# Patient Record
Sex: Female | Born: 1945 | Race: White | Hispanic: No | Smoking: Never smoker
Health system: Southern US, Community
[De-identification: ages and names within clinical notes are randomized; demographics above are authoritative.]

## PROBLEM LIST (undated history)

## (undated) DIAGNOSIS — F419 Anxiety disorder, unspecified: Secondary | ICD-10-CM

## (undated) DIAGNOSIS — I10 Essential (primary) hypertension: Secondary | ICD-10-CM

## (undated) DIAGNOSIS — D649 Anemia, unspecified: Secondary | ICD-10-CM

## (undated) DIAGNOSIS — E785 Hyperlipidemia, unspecified: Secondary | ICD-10-CM

## (undated) HISTORY — DX: Anxiety disorder, unspecified: F41.9

## (undated) HISTORY — DX: Essential (primary) hypertension: I10

## (undated) HISTORY — DX: Anemia, unspecified: D64.9

## (undated) HISTORY — PX: SHOULDER SURGERY: SHX246

## (undated) HISTORY — PX: TUBAL LIGATION: SHX77

## (undated) HISTORY — DX: Hyperlipidemia, unspecified: E78.5

## (undated) HISTORY — PX: ANKLE SURGERY: SHX546

## (undated) HISTORY — PX: BREAST IMPLANT REMOVAL: SUR1101

---

## 2004-10-15 ENCOUNTER — Ambulatory Visit: Payer: Self-pay | Admitting: Family Medicine

## 2004-11-20 ENCOUNTER — Ambulatory Visit: Payer: Self-pay | Admitting: Specialist

## 2006-03-28 DIAGNOSIS — M779 Enthesopathy, unspecified: Secondary | ICD-10-CM | POA: Insufficient documentation

## 2006-03-28 DIAGNOSIS — Z8739 Personal history of other diseases of the musculoskeletal system and connective tissue: Secondary | ICD-10-CM | POA: Insufficient documentation

## 2006-03-31 DIAGNOSIS — S39012A Strain of muscle, fascia and tendon of lower back, initial encounter: Secondary | ICD-10-CM | POA: Insufficient documentation

## 2006-05-24 ENCOUNTER — Ambulatory Visit: Payer: Self-pay | Admitting: Family Medicine

## 2007-01-14 ENCOUNTER — Observation Stay: Payer: Self-pay | Admitting: Internal Medicine

## 2007-01-14 ENCOUNTER — Other Ambulatory Visit: Payer: Self-pay

## 2007-01-15 ENCOUNTER — Other Ambulatory Visit: Payer: Self-pay

## 2007-07-07 DIAGNOSIS — E7849 Other hyperlipidemia: Secondary | ICD-10-CM | POA: Insufficient documentation

## 2007-08-21 ENCOUNTER — Ambulatory Visit: Payer: Self-pay | Admitting: Family Medicine

## 2008-01-21 ENCOUNTER — Ambulatory Visit: Payer: Self-pay | Admitting: Family Medicine

## 2008-01-23 ENCOUNTER — Ambulatory Visit: Payer: Self-pay | Admitting: Family Medicine

## 2008-04-15 ENCOUNTER — Ambulatory Visit: Payer: Self-pay | Admitting: Unknown Physician Specialty

## 2008-04-22 ENCOUNTER — Ambulatory Visit: Payer: Self-pay | Admitting: Unknown Physician Specialty

## 2008-12-26 ENCOUNTER — Inpatient Hospital Stay: Payer: Self-pay | Admitting: Psychiatry

## 2008-12-30 DIAGNOSIS — F4322 Adjustment disorder with anxiety: Secondary | ICD-10-CM | POA: Insufficient documentation

## 2009-06-03 ENCOUNTER — Ambulatory Visit: Payer: Self-pay | Admitting: Family Medicine

## 2009-06-03 DIAGNOSIS — M25579 Pain in unspecified ankle and joints of unspecified foot: Secondary | ICD-10-CM | POA: Insufficient documentation

## 2009-06-10 ENCOUNTER — Ambulatory Visit: Payer: Self-pay | Admitting: Internal Medicine

## 2009-07-28 DIAGNOSIS — K5900 Constipation, unspecified: Secondary | ICD-10-CM | POA: Insufficient documentation

## 2009-09-23 DIAGNOSIS — R1013 Epigastric pain: Secondary | ICD-10-CM | POA: Insufficient documentation

## 2009-09-23 DIAGNOSIS — R42 Dizziness and giddiness: Secondary | ICD-10-CM | POA: Insufficient documentation

## 2009-10-22 ENCOUNTER — Emergency Department: Payer: Self-pay | Admitting: Internal Medicine

## 2010-02-20 ENCOUNTER — Ambulatory Visit: Payer: Self-pay | Admitting: Internal Medicine

## 2010-03-04 ENCOUNTER — Ambulatory Visit: Payer: Self-pay | Admitting: Family Medicine

## 2010-04-28 ENCOUNTER — Ambulatory Visit: Payer: Self-pay | Admitting: Family Medicine

## 2010-09-21 ENCOUNTER — Ambulatory Visit: Payer: Self-pay | Admitting: Family Medicine

## 2011-02-26 ENCOUNTER — Ambulatory Visit: Payer: Self-pay

## 2011-03-19 ENCOUNTER — Ambulatory Visit: Payer: Self-pay | Admitting: Internal Medicine

## 2011-06-09 ENCOUNTER — Ambulatory Visit: Payer: Self-pay | Admitting: Family Medicine

## 2011-06-09 ENCOUNTER — Ambulatory Visit: Payer: Self-pay | Admitting: Podiatry

## 2012-06-19 DIAGNOSIS — R350 Frequency of micturition: Secondary | ICD-10-CM | POA: Insufficient documentation

## 2012-06-19 DIAGNOSIS — R3989 Other symptoms and signs involving the genitourinary system: Secondary | ICD-10-CM | POA: Insufficient documentation

## 2012-06-19 DIAGNOSIS — N3941 Urge incontinence: Secondary | ICD-10-CM | POA: Insufficient documentation

## 2012-08-01 ENCOUNTER — Ambulatory Visit: Payer: Self-pay | Admitting: Family Medicine

## 2013-07-26 ENCOUNTER — Emergency Department: Payer: Self-pay | Admitting: Emergency Medicine

## 2013-07-26 ENCOUNTER — Ambulatory Visit: Payer: Self-pay

## 2013-07-26 LAB — CBC
HCT: 39.9 % (ref 35.0–47.0)
HGB: 13.1 g/dL (ref 12.0–16.0)
MCH: 29.9 pg (ref 26.0–34.0)
MCHC: 32.8 g/dL (ref 32.0–36.0)
MCV: 91 fL (ref 80–100)
PLATELETS: 367 10*3/uL (ref 150–440)
RBC: 4.38 10*6/uL (ref 3.80–5.20)
RDW: 13.1 % (ref 11.5–14.5)
WBC: 5.3 10*3/uL (ref 3.6–11.0)

## 2013-07-26 LAB — BASIC METABOLIC PANEL
Anion Gap: 7 (ref 7–16)
BUN: 12 mg/dL (ref 7–18)
Calcium, Total: 9.6 mg/dL (ref 8.5–10.1)
Chloride: 104 mmol/L (ref 98–107)
Co2: 25 mmol/L (ref 21–32)
Creatinine: 1.08 mg/dL (ref 0.60–1.30)
GFR CALC NON AF AMER: 53 — AB
Glucose: 87 mg/dL (ref 65–99)
Osmolality: 271 (ref 275–301)
Potassium: 3.8 mmol/L (ref 3.5–5.1)
Sodium: 136 mmol/L (ref 136–145)

## 2013-10-25 ENCOUNTER — Ambulatory Visit: Payer: Self-pay | Admitting: Family Medicine

## 2014-02-20 ENCOUNTER — Inpatient Hospital Stay: Payer: Self-pay | Admitting: Surgery

## 2014-02-20 LAB — URINALYSIS, COMPLETE
BLOOD: NEGATIVE
Glucose,UR: NEGATIVE mg/dL (ref 0–75)
Hyaline Cast: 8
Nitrite: NEGATIVE
Ph: 6 (ref 4.5–8.0)
Protein: 30
RBC,UR: 10 /HPF (ref 0–5)
Specific Gravity: 1.019 (ref 1.003–1.030)
WBC UR: 3 /HPF (ref 0–5)

## 2014-02-20 LAB — CBC
HCT: 41.2 % (ref 35.0–47.0)
HGB: 13.2 g/dL (ref 12.0–16.0)
MCH: 29.2 pg (ref 26.0–34.0)
MCHC: 32.2 g/dL (ref 32.0–36.0)
MCV: 91 fL (ref 80–100)
Platelet: 432 10*3/uL (ref 150–440)
RBC: 4.54 10*6/uL (ref 3.80–5.20)
RDW: 13.7 % (ref 11.5–14.5)
WBC: 11.5 10*3/uL — ABNORMAL HIGH (ref 3.6–11.0)

## 2014-02-20 LAB — COMPREHENSIVE METABOLIC PANEL
ANION GAP: 8 (ref 7–16)
AST: 18 U/L (ref 15–37)
Albumin: 3.7 g/dL (ref 3.4–5.0)
Alkaline Phosphatase: 65 U/L
BUN: 11 mg/dL (ref 7–18)
Bilirubin,Total: 0.4 mg/dL (ref 0.2–1.0)
CHLORIDE: 98 mmol/L (ref 98–107)
CREATININE: 0.97 mg/dL (ref 0.60–1.30)
Calcium, Total: 9.3 mg/dL (ref 8.5–10.1)
Co2: 27 mmol/L (ref 21–32)
EGFR (Non-African Amer.): >60
Glucose: 153 mg/dL — ABNORMAL HIGH (ref 65–99)
OSMOLALITY: 269 (ref 275–301)
Potassium: 3.5 mmol/L (ref 3.5–5.1)
SGPT (ALT): 26 U/L
Sodium: 133 mmol/L — ABNORMAL LOW (ref 136–145)
Total Protein: 7.4 g/dL (ref 6.4–8.2)

## 2014-02-20 LAB — LIPASE, BLOOD: Lipase: 115 U/L (ref 73–393)

## 2014-02-21 LAB — CBC WITH DIFFERENTIAL/PLATELET
BASOS PCT: 0.6 %
Basophil #: 0 10*3/uL (ref 0.0–0.1)
EOS ABS: 0.2 10*3/uL (ref 0.0–0.7)
Eosinophil %: 1.9 %
HCT: 35.8 % (ref 35.0–47.0)
HGB: 11.9 g/dL — ABNORMAL LOW (ref 12.0–16.0)
LYMPHS PCT: 28 %
Lymphocyte #: 2.3 10*3/uL (ref 1.0–3.6)
MCH: 29.8 pg (ref 26.0–34.0)
MCHC: 33.1 g/dL (ref 32.0–36.0)
MCV: 90 fL (ref 80–100)
MONO ABS: 0.9 x10 3/mm (ref 0.2–0.9)
Monocyte %: 10.6 %
NEUTROS ABS: 4.8 10*3/uL (ref 1.4–6.5)
Neutrophil %: 58.9 %
Platelet: 386 10*3/uL (ref 150–440)
RBC: 3.98 10*6/uL (ref 3.80–5.20)
RDW: 13.7 % (ref 11.5–14.5)
WBC: 8.1 10*3/uL (ref 3.6–11.0)

## 2014-02-21 LAB — COMPREHENSIVE METABOLIC PANEL
ALBUMIN: 2.8 g/dL — AB (ref 3.4–5.0)
ALK PHOS: 55 U/L
ALT: 18 U/L
AST: 22 U/L (ref 15–37)
Anion Gap: 7 (ref 7–16)
BILIRUBIN TOTAL: 0.3 mg/dL (ref 0.2–1.0)
BUN: 12 mg/dL (ref 7–18)
Calcium, Total: 8 mg/dL — ABNORMAL LOW (ref 8.5–10.1)
Chloride: 104 mmol/L (ref 98–107)
Co2: 28 mmol/L (ref 21–32)
Creatinine: 0.95 mg/dL (ref 0.60–1.30)
EGFR (Non-African Amer.): >60
GLUCOSE: 104 mg/dL — AB (ref 65–99)
Osmolality: 278 (ref 275–301)
POTASSIUM: 3.5 mmol/L (ref 3.5–5.1)
SODIUM: 139 mmol/L (ref 136–145)
TOTAL PROTEIN: 6 g/dL — AB (ref 6.4–8.2)

## 2014-02-24 DIAGNOSIS — Z8719 Personal history of other diseases of the digestive system: Secondary | ICD-10-CM | POA: Insufficient documentation

## 2014-05-25 LAB — TROPONIN I: TROPONIN-I: 0.04 ng/mL — AB

## 2014-05-25 LAB — BASIC METABOLIC PANEL
Anion Gap: 7 (ref 7–16)
BUN: 12 mg/dL
CHLORIDE: 99 mmol/L — AB
CREATININE: 0.99 mg/dL
Calcium, Total: 9.4 mg/dL
Co2: 28 mmol/L
Glucose: 108 mg/dL — ABNORMAL HIGH
POTASSIUM: 3.8 mmol/L
Sodium: 134 mmol/L — ABNORMAL LOW

## 2014-05-25 LAB — CBC
HCT: 35.3 % (ref 35.0–47.0)
HGB: 11.6 g/dL — ABNORMAL LOW (ref 12.0–16.0)
MCH: 28.4 pg (ref 26.0–34.0)
MCHC: 32.9 g/dL (ref 32.0–36.0)
MCV: 86 fL (ref 80–100)
Platelet: 330 10*3/uL (ref 150–440)
RBC: 4.1 10*6/uL (ref 3.80–5.20)
RDW: 14.4 % (ref 11.5–14.5)
WBC: 6 10*3/uL (ref 3.6–11.0)

## 2014-05-26 ENCOUNTER — Observation Stay
Admit: 2014-05-26 | Disposition: A | Discharge status: Home / Self Care | Payer: Self-pay | Attending: Internal Medicine | Admitting: Internal Medicine

## 2014-05-26 LAB — LIPID PANEL
Cholesterol: 203 mg/dL — ABNORMAL HIGH
HDL: 59 mg/dL
LDL CHOLESTEROL, CALC: 130 mg/dL — AB
TRIGLYCERIDES: 68 mg/dL
VLDL CHOLESTEROL, CALC: 14 mg/dL

## 2014-05-26 LAB — HEPATIC FUNCTION PANEL A (ARMC)
ALK PHOS: 50 U/L
ALT: 18 U/L
Albumin: 3.7 g/dL
BILIRUBIN TOTAL: 0.5 mg/dL
Bilirubin, Direct: <0.1 mg/dL
SGOT(AST): 24 U/L
TOTAL PROTEIN: 6.9 g/dL

## 2014-05-26 LAB — TROPONIN I: TROPONIN-I: 0.04 ng/mL — AB

## 2014-05-30 DIAGNOSIS — R079 Chest pain, unspecified: Secondary | ICD-10-CM | POA: Insufficient documentation

## 2014-06-15 NOTE — H&P (Signed)
   Subjective/Chief Complaint Nausea/vomiting, abdominal distention   History of Present Illness Ms. Leah [name] is [name] pleasant 69 yo F with [name] history of diverticulitis, prior tubal ligation and acid reflux who presents with 2 days of worsening abdominal distention, nausea and vomiting.  Had [name] similar episode which was not as severe around christmas.  Was doing well prior to this.  H/o ? large bowel obstruction in past due to stool impaction per her report.  H/o colon cancer, last colonoscopy in 2010.  No fevers/chills.  Min pain currently, nausea improved with NG tube.  CT shows possible distal SBO.  FH of colon cancer in grandson at age 69.   Past History Diverticulitis Osteoarthritis Acid reflux Hypercholeesterolemia H/o tubal ligation H/o ankle surgery H/o shoulder surgery H/o breast surgery   Code Status Full Code   Past Med/Surgical Hx:  Osteoarthritis:   anxiety:   diverticulitis:   acid reflux:   bronchitis:   Hypercholesterolemia:   Asthma:   Ankle Surgery - Right:   Shoulder Surgery - Right:   Tubal Ligation:   Breast Surgery:   ALLERGIES:  Codeine: Hallucinations  Sulfa drugs: Other  Clindamycin: Swelling  Family and Social History:  Family History Coronary Artery Disease  Hypertension  Diabetes Mellitus  Cancer   Social History negative tobacco, negative ETOH, negative Illicit drugs   Review of Systems:  Fever/Chills No   Cough No   Sputum No   Abdominal Pain Yes   Diarrhea No   Constipation Yes   Nausea/Vomiting Yes   SOB/DOE No   Chest Pain No   Dysuria No   Tolerating Diet Nauseated  Vomiting   Physical Exam:  GEN well developed, well nourished, no acute distress   HEENT pink conjunctivae, PERRL, hearing intact to voice, good dentition   RESP normal resp effort  clear BS   CARD regular rate  no murmur  no thrills   ABD denies tenderness  no hernia  soft  distended  normal BS   EXTR negative cyanosis/clubbing, negative edema   SKIN  normal to palpation, No rashes, No ulcers   NEURO cranial nerves intact, negative rigidity, negative tremor, follows commands, strength:, motor/sensory function intact   PSYCH [name]+O to time, place, person, good insight    Assessment/Admission Diagnosis 69 yo F with history of tubal ligation with 2 days of N/V.  Obstipation.  CT concerning for SBO.  Labs unremarkable.   Plan Admit for NG decompression.  Serial abd exams. KUB in am to follow contrast progression.   Electronic Signatures: Jarvis NewcomerLundquist, Leah [name] (MD)  (Signed 30-Dec-15 17:14)  Authored: CHIEF COMPLAINT and HISTORY, PAST MEDICAL/SURGIAL HISTORY, ALLERGIES, FAMILY AND SOCIAL HISTORY, REVIEW OF SYSTEMS, PHYSICAL EXAM, ASSESSMENT AND PLAN   Last Updated: 30-Dec-15 17:14 by Jarvis NewcomerLundquist, [name] (MD)

## 2014-06-23 NOTE — H&P (Signed)
PATIENT NAME:  Leah [name], Leah [name] MR#:  [id number] DATE OF BIRTH:  Feb 15, 1946  DATE OF ADMISSION:  05/26/2014  The patient was examined by me before 12 midnight of April 3.   REFERRING PHYSICIAN: Gladstone Pihavid Schaevitz, MD  PRIMARY CARE PRACTITIONER: Leah GrosserNancy J. Maloney, MD   ADMITTING PHYSICIAN: Leah FiguresEdavally N. Trystan Akhtar, MD    PRIMARY CARDIOLOGIST: Leah BlinksBruce J. Kowalski, MD  CHIEF COMPLAINT: Retrosternal chest pain associated with lightheadedness and mild shortness of breath while at work.   HISTORY OF PRESENT ILLNESS: A 69 year old Caucasian female with a past medical history of borderline hyperlipidemia, generalized anxiety disorder, gastroesophageal reflux disease, diverticulitis, and degenerative joint disease, presents to the Emergency Room with the complaints of retrosternal chest pain that happened while she was at work. The patient states she was at her work and was doing fine up until around 6:00 p.m. she suddenly experienced retrosternal chest pain that was associated with mild lightheadedness and shortness of breath. Hence, EMS was called on site and the patient was brought to the Emergency Room for further evaluation. Denies any palpitations. No radiation of chest pain. She did have some mild nausea, but no vomiting. No focal weakness or numbness. No diaphoresis. No cough or sputum. In the Emergency Room, the patient was evaluated by the ED physician and was found to have elevated blood pressure on arrival, but subsequently blood pressure came into the normal range. Initial examination was normal and initial labs were also unremarkable with troponin first set being unremarkable. The patient was given 4 tablets of aspirin and was also given GI cocktail in the Emergency Room, following which her chest pain resolved. The patient currently remains chest pain-free and does not have any complaints except for mild headache. She was being observed in the Emergency Room and the second set of troponin came back as mildly  elevated, at 0.04. Hence, hospitalist service was consulted for further evaluation and management. The patient denies any chest pain, shortness of breath at this time and states she is doing fine.   PAST MEDICAL HISTORY:  1.  Borderline hyperlipidemia.  2.  Generalized anxiety disorder.  3.  Gastroesophageal reflux disease.  4.  History of diverticulitis.  5.  Degenerative joint disease.   PAST SURGICAL HISTORY:  1.  Right ankle surgery.  2.  Right shoulder surgery.  3.  Tubal ligation.  4.  Breast surgery.   ALLERGIES: CODEINE, CLINDAMYCIN, AND SULFA.   FAMILY HISTORY: Strong family history of heart disease with positive for sister and brothers, history of diabetes, hypertension, rheumatoid arthritis, degenerative joint disease in family members present. History of colon cancer in her 69 year old grandson.    SOCIAL HISTORY: She is single, lives at home. 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 No history of smoking. History of occasional wine intake. Denies any substance abuse.    HOME MEDICATIONS:  1.  Alprazolam 0.5 mg 1 tablet orally 2 times a day as needed.  2.  Aspirin 81 mg enteric coated 1 tablet once a day.  3.  Claritin 10 mg 1 tablet orally once a day.  4.  Etodolac 500 mg oral tablet 1 tablet 2 times a day.  5.  Flonase nasal spray 2 sprays in each nostril daily once a day.  6.  Multivitamin 1 tablet once a day.  7.  Omeprazole 40 mg 1 tablet orally once a day.  8.  Paxil 20 mg 1 tablet orally once a day.  9.  Tramadol 50 mg 1 tablet orally 2 times a  day.    REVIEW OF SYSTEMS:  CONSTITUTIONAL: Negative for fever, chills, generalized weakness, or fatigue.  EYES: Negative for blurred vision, double vision. No pain. No redness. No discharge.  EARS, NOSE, AND THROAT: Negative for tinnitus, ear pain, hearing loss, epistaxis, nasal discharge.  RESPIRATORY: Negative for cough, wheezing, dyspnea, hemoptysis, or painful respiration.  CARDIOVASCULAR: Positive for retrosternal chest  pain with associated lightheadedness and shortness of breath, as noted in the history of present illness. Currently she is chest pain-free. Denies any palpitations. No orthopnea. No dyspnea on exertion. No pedal edema. No syncopal episodes.  GASTROINTESTINAL: Positive for mild nausea at the time of chest pain, but denies any nausea, vomiting, diarrhea, abdominal pain at this time. History of GERD symptoms stable on daily PPI.  GENITOURINARY: Negative for dysuria, frequency, urgency, or hematuria.  ENDOCRINE: Negative for polyuria, nocturia, heat or cold intolerance.  HEMATOLOGIC AND LYMPHATIC: Negative for anemia, easy bruising or bleeding, or swollen glands.  INTEGUMENTARY: Negative for acne, skin rash, or lesions.  MUSCULOSKELETAL: History of degenerative joint disease for which she takes anti-inflammatory medications. Currently denies any joint pains.  NEUROLOGICAL: Negative for focal weakness or numbness. No history of CVA, TIA, or seizure disorder.  PSYCHIATRIC: Positive for any history of anxiety disorder and stable on home medications.   PHYSICAL EXAMINATION:  VITAL SIGNS: Temperature 97.7 degrees Fahrenheit; pulse rate 64 per minute; respirations 18 per minute; blood pressure on arrival 217/100, current blood pressure is 140/86; oxygen saturation 98% on room air.  GENERAL: Well developed, well nourished, alert and oriented, comfortably resting in the bed, not in any acute distress.  HEAD: Atraumatic, normocephalic.  EYES: Pupils equal, react to light and accommodation. No conjunctival pallor. No icterus. Extraocular movements intact.  NOSE: No drainage. No lesions.  EARS: No drainage. No external lesions.  ORAL CAVITY: No mucosal lesions. No exudates.   NECK: Supple. No JVD. No thyromegaly. No carotid bruit. Range of motion of neck within normal limits.  RESPIRATORY: Good respiratory effort. Not using accessory muscles of respiration. Bilateral vesicular breath sounds present. No rales or  rhonchi.  CARDIOVASCULAR: S1, S2 regular. No murmurs, gallops, or clicks. Pulses equal at carotid, femoral, pedal pulses. No peripheral edema.  GASTROINTESTINAL: Abdomen soft, nontender. No hepatosplenomegaly. No masses. No rigidity. No guarding. Bowel sounds present in all quadrants.  GENITOURINARY: Deferred.  MUSCULOSKELETAL: No joint tenderness or effusion. Range of motion adequate. Strength and tone equal bilaterally.  SKIN: Inspection within normal limits.  LYMPH NODES: No cervical lymphadenopathy.  VASCULAR: Good dorsalis pedis and posterior tibial pulses.  NEUROLOGICAL: Alert, awake, and oriented x 3. Cranial nerves II-XII grossly intact. No sensory deficit. Motor strength 5/5 upper and lower extremities. DTRs 2+ bilateral and symmetrical.  PSYCHIATRIC: Alert, awake, and oriented x 3. Judgment and insight adequate. Memory and mood within normal limits.   ANCILLARY DATA:  LABORATORY DATA: Serum glucose 108, BUN 12, creatinine 0.99, sodium 134, potassium 3.8, chloride 99, bicarbonate 28, calcium 9.4. Troponin first set less than 0.03, second set elevated at 0.04. WBC 6.0, hemoglobin 11.6, hematocrit 35.3, platelet count 330,000.   IMAGING: Chest x-ray: Subsegmental atelectasis or scarring in the left mid to lower lung. Otherwise, no significant abnormalities observed.   EKG: Normal sinus rhythm with ventricular rate of 61 beats per minute. Incomplete right bundle branch block. No acute ST-T changes.    ASSESSMENT AND PLAN: A 69 year old Caucasian female with a past medical history of borderline hyperlipidemia, generalized anxiety disorder, gastroesophageal reflux disease, degenerative joint disease, presents with  retrosternal chest pain with associated shortness of breath and lightheadedness while at work, resolved with aspirin and gastrointestinal cocktail, found to have mild elevation of second set of troponin. EKG with no acute ischemic changes and the patient is chest pain-free. Admitted  for further evaluation and management.  1.  Chest pain, which is retrosternal with associated shortness of breath and lightheadedness in the setting of coronary artery disease risk factors, namely strong family history of coronary artery disease and borderline hyperlipidemia, concerning for angina. Elevated troponin, which is second set, mild. Non-ST elevation myocardial infarction versus demand ischemia. Plan: Admit to telemetry. Aspirin, nitroglycerin, beta blocker, statin. Cycle cardiac enzymes and order echocardiogram and cardiology consultation for further evaluation. Check fasting lipids in the morning.  2.  History of anxiety related anxiety disorder, stable on home medications. Continue same.  3.  History of gastroesophageal reflux disease, stable on proton pump inhibitor. Continue same.  4.  Hyperlipidemia, not on any medications. Plan: Check fasting lipids. We will start atorvastatin 40 mg daily in the setting for chest pain concerning for angina.  5.  Degenerative joint disease, no acute symptoms now. We hold off anti-inflammatory medications for now and use Tylenol as needed.  6.  Deep vein thrombosis prophylaxis. Subcutaneous Lovenox.  7.  Gastrointestinal prophylaxis: Proton pump inhibitor.   CODE STATUS: Full code.   TIME SPENT: 50 minutes.    ____________________________ Leah Figures, MD enr:bm D: 05/26/2014 16:10:96 ET T: 05/26/2014 01:03:05 ET JOB#: [id number]  cc: Leah Figures, MD, <Dictator> Leah Grosser, MD Leah Figures MD ELECTRONICALLY SIGNED 05/26/2014 18:24

## 2014-06-23 NOTE — Discharge Summary (Signed)
PATIENT NAME:  [name], Leah F MR#:  [id number] DATE OF BIRTH:  02/24/1945  DATE OF ADMISSION:  02/20/2014 DATE OF DISCHARGE:  02/24/2014  DISCHARGE DIAGNOSES:  1.  Small bowel obstruction, resolved.  2.  History of diverticulitis.  3.  History of osteoarthritis.  4.  History of acid reflux.  5.  History of hypercholesterolemia.  6.  History of tubal ligation.  7.  History of her ankle and shoulder surgery.  8.  History of breast surgery.   DISCHARGE MEDICATIONS: As follows: Claritin 10 mg p.o. daily, etodolac 500 mg p.o. b.i.d., Xanax 0.5 p.o. b.i.d. p.r.n., Flonase 50 mcg per inhaled 2 sprays each nasal daily, ranitidine 150 mg p.o. b.i.d., tramadol 50 mg p.o. b.i.d., multivitamin 1 tablet p.o. daily, aspirin 81 p.o. daily, Paxil 20 mg p.o. at bedtime.   INDICATION FOR ADMISSION: Leah [name] is a pleasant 69 year old female who presents with nausea, vomiting, abdominal distention, abdominal pain who on CT scan showed possible small bowel obstruction. She was admitted for management of probable small bowel obstruction.   HOSPITAL COURSE: As follows: The patient ended up having an NG tube placed and was given IV fluids. On hospital day 2, it appeared that contrast had gone past the point of obstruction on plain film. Throughout the next few days, her NG tube output decreased to minimal. She began having bowel function and, at this point, her tube was removed. She was advanced from liquids to a regular diet. At the time of discharge, she was taking good p.o. with  no pain was voiding and stooling without difficulties.   DISCHARGE INSTRUCTIONS: As follows: Leah [name] is to call or return to the ED if has increased pain, nausea, vomiting, or change in bowel habits.     ____________________________ Si Raiderhristopher A. Nevada Kirchner, MD cal:bm D: 03/05/2014 20:04:47 ET T: 03/06/2014 03:20:36 ET JOB#: [id number]  cc: Cristal Deerhristopher A. Tanveer Brammer, MD, <Dictator> Jarvis NewcomerHRISTOPHER A Devarion Mcclanahan MD ELECTRONICALLY  SIGNED 03/13/2014 9:01

## 2014-06-23 NOTE — Consult Note (Signed)
PATIENT NAME:  Leah [name], Leah [name] MR#:  [id number] DATE OF BIRTH:  04/05/1945  DATE OF CONSULTATION:  05/26/2014  REFERRING PHYSICIAN:  Sital P. Juliene PinaMody, M.D.  CONSULTING PHYSICIAN:  [name], MD  PRIMARY CARE PHYSICIAN:  [name], M.D.   CARDIOLOGIST:  Lamar BlinksBruce J. Kowalski, M.D.   CHIEF COMPLAINT: Chest discomfort and lightheadedness.   REASON FOR CONSULTATION: Requested for evaluation of chest pain and borderline elevated troponin.   HISTORY OF PRESENT ILLNESS: The patient is a 69 year old female with history of generalized anxiety disorder, borderline hyperlipidemia, admitted for chest pain. The patient presented to [facility] Emergency Room on 05/25/2014 with the chief complaint of chest discomfort. She describes it as a hard heart pounding without radiation, nausea, diaphoresis. The episode lasted only 3 to 5 minutes with mild recurrence. EKG was nondiagnostic. The patient was treated with a GI cocktail. The patient had borderline elevated troponin of 0.04, therefore was admitted to telemetry. Followup troponin was 0.04 without change.  Today, the patient denies any recurrent chest pain. EKG was nondiagnostic.   PAST MEDICAL HISTORY:  Borderline hyperlipidemia. Generalized anxiety disorder.   MEDICATIONS: Aspirin 81 mg daily, alprazolam 0.5 mg b.i.d. p.r.n., Claritin 10 mg daily, etodolac 500 mg b.i.d., Flonase nasal spray 2 sprays each nostril daily, multivitamin 1 daily, omeprazole 40 mg daily, Paxil 20 mg daily, tramadol 50 mg b.i.d.   SOCIAL HISTORY: The patient is single. She lives with her daughter. She denies tobacco abuse.   FAMILY HISTORY: The patient reports history for coronary artery disease.   REVIEW OF SYSTEMS: CONSTITUTIONAL: No fever or chills.  EYES: No blurry vision.  EARS: No hearing loss.  RESPIRATORY: No shortness of breath.  CARDIOVASCULAR: Chest discomfort as described above.  GASTROINTESTINAL: No nausea, vomiting, or diarrhea.  GENITOURINARY: No  dysuria or hematuria.  ENDOCRINE: No polyuria or polydipsia.  MUSCULOSKELETAL: No arthralgias or myalgias.  NEUROLOGICAL: No focal muscle weakness or numbness.  PSYCHOLOGICAL: No depression or anxiety.   PHYSICAL EXAMINATION:  VITAL SIGNS: Blood pressure 144/89, pulse 66, respirations 20, temperature 97.8, pulse oximetry 94%.  HEENT: Pupils equal and reactive to light and accommodation.  NECK: Supple without thyromegaly.  LUNGS: Clear.  CARDIOVASCULAR: Normal JVP. Normal PMI. Regular rate and rhythm. Normal S1, S2. No appreciable gallop, murmur, or rub.  ABDOMEN: Soft and nontender. Pulses were intact bilaterally.  MUSCULOSKELETAL: Normal muscle tone.  NEUROLOGIC: The patient is alert and oriented x 3. Motor and sensory both grossly intact.   IMPRESSION: A 69 year old female with brief, atypical chest pain with history of generalized anxiety disorder with borderline elevated troponin without peak or trough, likely nonacute coronary syndrome. EKG was nondiagnostic.   RECOMMENDATIONS:  1.  Agree with overall current therapy.  2.  Would defer full dose anticoagulation.  3.  The patient may be discharged, follow up with Dr. Gwen PoundsKowalski, for further outpatient cardiac evaluation.   ____________________________ Marcina MillardAlexander Seraphim Trow, MD ap:sp D: 05/26/2014 10:29:21 ET T: 05/26/2014 10:40:57 ET JOB#: [id number]  cc: Marcina MillardAlexander Finlee Concepcion, MD, <Dictator> Marcina MillardALEXANDER Harshil Cavallaro MD ELECTRONICALLY SIGNED 06/18/2014 17:15

## 2014-06-23 NOTE — Discharge Summary (Signed)
PATIENT NAME:  Leah [name], Leah [name] MR#:  [id number] DATE OF BIRTH:  1945-03-30  DATE OF ADMISSION:  05/26/2014 DATE OF DISCHARGE:  05/26/2014  ADMISSION DIAGNOSIS: Chest pain with elevated troponin.   DISCHARGE DIAGNOSES:  1.  Chest pain with minimal elevation in troponin due to anxiety and not acute coronary syndrome.  2.  Anxiety disorder.  3.  Essential hypertension.  4.  New diagnosis of hyperlipidemia.   CONSULTATION: Marcina Millard, MD  LABORATORY DATA: At discharge, troponin was 0.03 and then 0.04 x 2.   LDL is 130, cholesterol is 203.   DISCHARGE PHYSICAL EXAMINATION:  VITAL SIGNS: Temperature 97.8, pulse is 66, respirations 20, blood pressure 144/89, and 94% on room air.  GENERAL: The patient is alert and oriented, not in acute distress.  CARDIOVASCULAR: Regular rate and rhythm. No murmur, gallops, or rubs. PMI is not displaced.  LUNGS: Clear to auscultation without crackles, rales, rhonchi, or wheezing. Normal to percussion.  ABDOMEN: Obese. Bowel sounds are positive. Nontender. No hepatosplenomegaly.  EXTREMITIES: No clubbing, cyanosis, or edema.   HOSPITAL COURSE: This is a very pleasant 69 year old female who was at work when she developed substernal chest pain and shortness of breath. For further details please refer to H and P.  1.  Chest pain. The patient was admitted to rule out acute coronary syndrome. Her first troponin was less than 0.03, which is normal and the next 2 were 0.04, which is borderline. This is not an acute coronary syndrome. Cardiology was consulted. They did not feel that this minimal bump in troponin was related to acute coronary syndrome. She will see Dr. Gwen Pounds as an outpatient and recommendations are to have an outpatient stress test. Her last 1 was in 2010. She is discharged with aspirin, beta blocker, and statin; but again this is not ACS. She had no chest pain while in the hospital and I suspect her elevation in troponin was mainly due to some  anxiety.  2.  History of anxiety disorder. The patient will continue her home medications including benzodiazepine p.r.n., as well as Paxil.  3.  History of GERD. The patient is on a PPI.  4.  Hyperlipidemia. This was recently diagnosed and according to the patient her physician was going to put her on medications, so we started medications here. She will need a repeat lipid panel, as well as LFTs in 6 weeks.  5.  Elevated blood pressure with hypertension. The patient was started on metoprolol. She also says her primary care physician was supposed to start her on medications for blood pressure as well.   DISCHARGE MEDICATIONS: 1.  Claritin 10 mg daily.  2.  Xanax 0.5 mg b.i.d. p.r.n.  3.  Flonase 50 mcg daily.  4.  Paxil 20 mg at bedtime.  5.  Tramadol 50 mg b.i.d.  6.  Multivitamin 1 tablet daily.  7.  Aspirin 81 mg daily.  8.  Omeprazole 40 mg daily.  9.  Atorvastatin 40 mg daily.  10.  Metoprolol 25 mg b.i.d.   DISCHARGE DIET: Low sodium.   DISCHARGE ACTIVITY: As tolerated.   DISCHARGE FOLLOWUP: The patient will follow up with Dr. Gwen Pounds in 1 to 2 days, as well as Dr. Elease Hashimoto.   TIME SPENT: Approximately 40 minutes.   The patient was medically stable for discharge.   ____________________________ Aloura Matsuoka P. Juliene Pina, MD spm:TT D: 05/26/2014 12:28:25 ET T: 05/27/2014 01:15:16 ET JOB#: [id number]  cc: Jarvis Sawa P. Juliene Pina, MD, <Dictator> Lamar Blinks, MD Leo Grosser, MD  Tyjuan Demetro P Shray Hunley MD ELECTRONICALLY SIGNED 05/27/2014 12:42

## 2017-03-07 ENCOUNTER — Telehealth: Payer: Self-pay

## 2017-03-07 NOTE — Telephone Encounter (Signed)
[facility]- they stated Insurance is requiring patient to do a 2pack for trial @ dose 1.2 mg. Patient is currently doing 1.8 mg. Ask pharmacy to send over PA form. Will do PA for patient insurance. Awaiting form.

## 2017-12-09 DIAGNOSIS — F411 Generalized anxiety disorder: Secondary | ICD-10-CM | POA: Insufficient documentation

## 2017-12-09 DIAGNOSIS — M1990 Unspecified osteoarthritis, unspecified site: Secondary | ICD-10-CM | POA: Insufficient documentation

## 2017-12-09 DIAGNOSIS — E669 Obesity, unspecified: Secondary | ICD-10-CM | POA: Insufficient documentation

## 2018-06-23 DIAGNOSIS — D75839 Thrombocytosis, unspecified: Secondary | ICD-10-CM | POA: Insufficient documentation

## 2018-06-23 DIAGNOSIS — M2550 Pain in unspecified joint: Secondary | ICD-10-CM | POA: Insufficient documentation

## 2018-06-23 DIAGNOSIS — I10 Essential (primary) hypertension: Secondary | ICD-10-CM | POA: Insufficient documentation

## 2018-06-23 DIAGNOSIS — F32A Depression, unspecified: Secondary | ICD-10-CM | POA: Insufficient documentation

## 2018-06-23 DIAGNOSIS — D649 Anemia, unspecified: Secondary | ICD-10-CM | POA: Insufficient documentation

## 2018-06-23 DIAGNOSIS — K219 Gastro-esophageal reflux disease without esophagitis: Secondary | ICD-10-CM | POA: Insufficient documentation

## 2018-06-23 DIAGNOSIS — M2540 Effusion, unspecified joint: Secondary | ICD-10-CM | POA: Insufficient documentation

## 2019-03-19 DIAGNOSIS — K259 Gastric ulcer, unspecified as acute or chronic, without hemorrhage or perforation: Secondary | ICD-10-CM | POA: Insufficient documentation

## 2019-08-07 ENCOUNTER — Encounter: Payer: Self-pay | Admitting: Medical Oncology

## 2019-08-07 ENCOUNTER — Emergency Department
Admission: EM | Admit: 2019-08-07 | Discharge: 2019-08-07 | Disposition: A | Discharge status: Home / Self Care | Payer: Medicare HMO | Attending: Emergency Medicine | Admitting: Emergency Medicine

## 2019-08-07 ENCOUNTER — Other Ambulatory Visit: Payer: Self-pay

## 2019-08-07 ENCOUNTER — Emergency Department: Payer: Medicare HMO

## 2019-08-07 DIAGNOSIS — R410 Disorientation, unspecified: Secondary | ICD-10-CM | POA: Diagnosis not present

## 2019-08-07 LAB — BASIC METABOLIC PANEL
Anion gap: 9 (ref 5–15)
BUN: 17 mg/dL (ref 8–23)
CO2: 23 mmol/L (ref 22–32)
Calcium: 9.2 mg/dL (ref 8.9–10.3)
Chloride: 100 mmol/L (ref 98–111)
Creatinine, Ser: 1.16 mg/dL — ABNORMAL HIGH (ref 0.44–1.00)
GFR calc Af Amer: 54 mL/min — ABNORMAL LOW (ref >60)
GFR calc non Af Amer: 47 mL/min — ABNORMAL LOW (ref >60)
Glucose, Bld: 98 mg/dL (ref 70–99)
Potassium: 3.9 mmol/L (ref 3.5–5.1)
Sodium: 132 mmol/L — ABNORMAL LOW (ref 135–145)

## 2019-08-07 LAB — CBC
HCT: 38 % (ref 36.0–46.0)
Hemoglobin: 12.6 g/dL (ref 12.0–15.0)
MCH: 31 pg (ref 26.0–34.0)
MCHC: 33.2 g/dL (ref 30.0–36.0)
MCV: 93.6 fL (ref 80.0–100.0)
Platelets: 360 10*3/uL (ref 150–400)
RBC: 4.06 MIL/uL (ref 3.87–5.11)
RDW: 13.3 % (ref 11.5–15.5)
WBC: 9 10*3/uL (ref 4.0–10.5)
nRBC: 0 % (ref 0.0–0.2)

## 2019-08-07 LAB — URINALYSIS, COMPLETE (UACMP) WITH MICROSCOPIC
Bacteria, UA: NONE SEEN
Bilirubin Urine: NEGATIVE
Glucose, UA: NEGATIVE mg/dL
Hgb urine dipstick: NEGATIVE
Ketones, ur: NEGATIVE mg/dL
Leukocytes,Ua: NEGATIVE
Nitrite: NEGATIVE
Protein, ur: NEGATIVE mg/dL
Specific Gravity, Urine: 1.013 (ref 1.005–1.030)
pH: 5 (ref 5.0–8.0)

## 2019-08-07 LAB — TSH: TSH: 1.064 u[IU]/mL (ref 0.350–4.500)

## 2019-08-07 LAB — T4, FREE: Free T4: 0.83 ng/dL (ref 0.61–1.12)

## 2019-08-07 LAB — TROPONIN I (HIGH SENSITIVITY): Troponin I (High Sensitivity): 4 ng/L (ref <18)

## 2019-08-07 MED ORDER — SODIUM CHLORIDE 0.9 % IV BOLUS
500.0000 mL | Freq: Once | INTRAVENOUS | Status: AC
  Administered 2019-08-07: 500 mL via INTRAVENOUS

## 2019-08-07 NOTE — ED Provider Notes (Signed)
[facility] Emergency Department Provider Note  ____________________________________________   First MD Initiated Contact with Patient 08/07/19 1830     (approximate)  I have reviewed the triage vital signs and the nursing notes.   HISTORY  Chief Complaint Loss of Consciousness    HPI Leah [name] is a 74 y.o. female with history of anxiety who comes in with concerns for confusion.  I discussed with the patient's granddaughter who stated that she had left home 2 to 3 hours and at that time she was acting her normal self but then when she got home she seem more confused where she did not remember where the granddaughter had gone, did not know that she had clean the bathroom, did not remember that she was going to visit her other daughter who is now pregnant.  They stated that she was still able to talk and had not fallen down on the ground or anything like that but that she was just more confused.  They state that she had an episode of this previously in the setting of one of her family members dying 10 years ago where she had some confusion they attributed to more anxiety.  Patient stated that when this episode happened it lasted for over 13 hours where she has no memory.  Patient states that she denies anybody dying recently but states that she has been feeling more anxious because she was planning to make her first solo car drive and she has not done that since having her few episode last time.  She at this time states that she is feeling better and feels closer to her baseline but does have a little bit of a headache.  She denies any chest pain, urinary symptoms   History reviewed. No pertinent past medical history.  There are no problems to display for this patient.  Prior to Admission medications   Not on File    Allergies Patient has no known allergies.  No family history on file.  Social History Denies alcohol, drugs  Review of  Systems Constitutional: No fever/chills Eyes: No visual changes. ENT: No sore throat. Cardiovascular: Denies chest pain. Respiratory: Denies shortness of breath. Gastrointestinal: No abdominal pain.  No nausea, no vomiting.  No diarrhea.  No constipation. Genitourinary: Negative for dysuria. Musculoskeletal: Negative for back pain. Skin: Negative for rash. Neurological: Negative for headaches, focal weakness or numbness.  Episode of not remembering All other ROS negative ____________________________________________   PHYSICAL EXAM:  VITAL SIGNS: ED Triage Vitals  Enc Vitals Group     BP 08/07/19 1526 (!) 151/80     Pulse Rate 08/07/19 1526 (!) 52     Resp 08/07/19 1526 16     Temp 08/07/19 1526 98.3 F (36.8 C)     Temp Source 08/07/19 1526 Oral     SpO2 08/07/19 1526 98 %     Weight 08/07/19 1527 165 lb (74.8 kg)     Height 08/07/19 1527 5\' 3"  (1.6 m)     Head Circumference --      Peak Flow --      Pain Score 08/07/19 1527 0     Pain Loc --      Pain Edu? --      Excl. in [place]? --     Constitutional: Alert and oriented. Well appearing and in no acute distress. Eyes: Conjunctivae are normal. EOMI. Head: Atraumatic. Nose: No congestion/rhinnorhea. Mouth/Throat: Mucous membranes are moist.   Neck: No stridor. Trachea Midline.  FROM Cardiovascular: Normal rate, regular rhythm. Grossly normal heart sounds.  Good peripheral circulation. Respiratory: Normal respiratory effort.  No retractions. Lungs CTAB. Gastrointestinal: Soft and nontender. No distention. No abdominal bruits.  Musculoskeletal: No lower extremity tenderness nor edema.  No joint effusions. Neurologic:  Normal speech and language. No gross focal neurologic deficits are appreciated.  Cranial nerves II to XII are intact.  Equal strength in arms and legs. Skin:  Skin is warm, dry and intact. No rash noted. Psychiatric: Mood and affect are normal. Speech and behavior are normal. GU: Deferred    ____________________________________________   LABS (all labs ordered are listed, but only abnormal results are displayed)  Labs Reviewed  BASIC METABOLIC PANEL - Abnormal; Notable for the following components:      Result Value   Sodium 132 (*)    Creatinine, Ser 1.16 (*)    GFR calc non Af Amer 47 (*)    GFR calc Af Amer 54 (*)    All other components within normal limits  URINALYSIS, COMPLETE (UACMP) WITH MICROSCOPIC - Abnormal; Notable for the following components:   Color, Urine YELLOW (*)    APPearance CLEAR (*)    All other components within normal limits  CBC   ____________________________________________   ED ECG REPORT I, Concha Se, the attending physician, personally viewed and interpreted this ECG.  Sinus bradycardia rate of 49, no ST elevation, T wave inversion in lead III V2, V3, normal intervals otherwise ____________________________________________  RADIOLOGY   Official radiology report(s): CT Head Wo Contrast  Result Date: 08/07/2019 CLINICAL DATA:  Transient episode of memory loss today. EXAM: CT HEAD WITHOUT CONTRAST TECHNIQUE: Contiguous axial images were obtained from the base of the skull through the vertex without intravenous contrast. COMPARISON:  Head CT 07/26/2013. FINDINGS: Brain: No evidence of acute infarction, hemorrhage, hydrocephalus, extra-axial collection or mass lesion/mass effect. Vascular: No hyperdense vessel or unexpected calcification. Skull: Intact.  No focal lesion. Sinuses/Orbits: Negative. Other: None. IMPRESSION: Negative head CT. Electronically Signed   By: Drusilla Kanner M.D.   On: 08/07/2019 19:24    ____________________________________________   PROCEDURES  Procedure(s) performed (including Critical Care):  Procedures   ____________________________________________   INITIAL IMPRESSION / ASSESSMENT AND PLAN / ED COURSE  Leah [name] was evaluated in Emergency Department on 08/07/2019 for the symptoms  described in the history of present illness. She was evaluated in the context of the global COVID-19 pandemic, which necessitated consideration that the patient might be at risk for infection with the SARS-CoV-2 virus that causes COVID-19. Institutional protocols and algorithms that pertain to the evaluation of patients at risk for COVID-19 are in a state of rapid change based on information released by regulatory bodies including the CDC and federal and state organizations. These policies and algorithms were followed during the patient's care in the ED.    Patient is a 74 year old with normal vital signs except for slightly bradycardic who comes in for episode of transient this dis-awareness.  Will get labs to evaluate for Electra abnormalities, AKI, UTI.  Did not fall did not hit her head will get CT head to make sure no evidence of large stroke.  On review of patient's records her heart rate typically runs in the 50s and denies any symptoms of dizziness.  Denies any chest pain to suggest ACS, shortness of breath to suggest PE, abdominal pain distress AAA patient states that she is feeling closer to baseline at this time   Kidney function slightly elevated at 1.16.  Sodium slightly low at 132 we will give patient 500 cc of fluid No signs of anemia or white count to suggest infection.  UA without evidence of UTI  CT head is negative.  Reevaluated patient she states that she is back to her baseline self.  We discussed admission for stroke/TIA work-up including MRI but patient is adamant that she is feeling closer to her baseline self and does not want to be admitted to the hospital.  I offered to compromise with patient do an MRI and if it was negative can send patient home and if it was positive but again I would recommend admission.  Patient again states she does not want to stay for MRI.  Patient understands that I am not fully evaluated her for ischemic stroke and understands the risk associated with  this.  She has a capacity make this decision.  She is able to repeat this back to me.  However she states that she does have a history of anxiety has had this happen before and has felt very anxious and thinks that it could be more likely related to that.  She understands that if she changes her mind and would like further work-up that she can always return to the ER for MRI and further work-up.  We also discussed return precautions.  Patient has been in the ER for over 6 hours and since arrival is not having neuro deficits and has been not altered.  She continues to have normal neuro exam.  This time we will discharge patient home with close follow-up.  We will give her information for neurology and encouraged her not to drive until she gets follow-up  I discussed the provisional nature of ED diagnosis, the treatment so far, the ongoing plan of care, follow up appointments and return precautions with the patient and any family or support people present. They expressed understanding and agreed with the plan, discharged home.          ____________________________________________   FINAL CLINICAL IMPRESSION(S) / ED DIAGNOSES   Final diagnoses:  Confusion      MEDICATIONS GIVEN DURING THIS VISIT:  Medications  sodium chloride 0.9 % bolus 500 mL (500 mLs Intravenous New Bag/Given 08/07/19 1921)     ED Discharge Orders    None       Note:  This document was prepared using Dragon voice recognition software and may include unintentional dictation errors.   Concha Se, MD 08/07/19 2044

## 2019-08-07 NOTE — ED Triage Notes (Signed)
Pt in via EMS from home. EMS reports pt was cleaning the BR with multiple different chemicals and then all the sudden she could not remember anything.   co2 29, RR 16-18, FSBS 93, 98.1 Temp, HR 56, negative stroke screen

## 2019-08-07 NOTE — Discharge Instructions (Addendum)
Your CT and labs reassuring except the slightly elevated kidney function which we have given you some fluid for.  We offered you admission for stroke versus TIA work-up but you have elected to want to leave.  This could be secondary to your anxiety but we we have not completed a full work-up for stroke.  If you have another episode you should return to the ER immediately.  At this time I think would be best to not drive and you should follow-up with neurology outpatient.  Please call make an appointment.  Return to the ER if you develop any worsening symptoms or you would like further work-up for this

## 2019-08-13 ENCOUNTER — Other Ambulatory Visit: Payer: Self-pay | Admitting: Neurology

## 2019-08-13 DIAGNOSIS — R4182 Altered mental status, unspecified: Secondary | ICD-10-CM

## 2019-08-13 DIAGNOSIS — R4189 Other symptoms and signs involving cognitive functions and awareness: Secondary | ICD-10-CM

## 2019-10-08 ENCOUNTER — Other Ambulatory Visit: Payer: Self-pay

## 2019-10-08 ENCOUNTER — Ambulatory Visit
Admission: RE | Admit: 2019-10-08 | Discharge: 2019-10-08 | Disposition: A | Discharge status: Home / Self Care | Payer: Medicare Other | Source: Ambulatory Visit | Attending: Neurology | Admitting: Neurology

## 2019-10-08 DIAGNOSIS — R4182 Altered mental status, unspecified: Secondary | ICD-10-CM | POA: Insufficient documentation

## 2019-10-08 DIAGNOSIS — R4189 Other symptoms and signs involving cognitive functions and awareness: Secondary | ICD-10-CM | POA: Diagnosis present

## 2019-10-08 MED ORDER — GADOBUTROL 1 MMOL/ML IV SOLN
7.0000 mL | Freq: Once | INTRAVENOUS | Status: AC | PRN
  Administered 2019-10-08: 7 mL via INTRAVENOUS

## 2020-01-22 ENCOUNTER — Ambulatory Visit: Payer: Medicare Other

## 2020-02-05 DIAGNOSIS — R569 Unspecified convulsions: Secondary | ICD-10-CM | POA: Insufficient documentation

## 2020-04-24 ENCOUNTER — Telehealth (INDEPENDENT_AMBULATORY_CARE_PROVIDER_SITE_OTHER): Payer: Self-pay | Admitting: General Practice

## 2020-05-02 ENCOUNTER — Ambulatory Visit (INDEPENDENT_AMBULATORY_CARE_PROVIDER_SITE_OTHER): Payer: Medicare Other | Admitting: Nurse Practitioner

## 2020-05-02 ENCOUNTER — Encounter (INDEPENDENT_AMBULATORY_CARE_PROVIDER_SITE_OTHER): Payer: Self-pay | Admitting: Nurse Practitioner

## 2020-05-02 ENCOUNTER — Other Ambulatory Visit: Payer: Self-pay

## 2020-05-02 VITALS — BP 133/86 | HR 57 | Resp 16 | Ht 68.0 in | Wt 167.2 lb

## 2020-05-02 DIAGNOSIS — M79604 Pain in right leg: Secondary | ICD-10-CM

## 2020-05-02 DIAGNOSIS — M7989 Other specified soft tissue disorders: Secondary | ICD-10-CM

## 2020-05-02 DIAGNOSIS — M79605 Pain in left leg: Secondary | ICD-10-CM

## 2020-05-02 DIAGNOSIS — I10 Essential (primary) hypertension: Secondary | ICD-10-CM

## 2020-05-02 DIAGNOSIS — M158 Other polyosteoarthritis: Secondary | ICD-10-CM | POA: Diagnosis not present

## 2020-05-03 ENCOUNTER — Encounter (INDEPENDENT_AMBULATORY_CARE_PROVIDER_SITE_OTHER): Payer: Self-pay | Admitting: Nurse Practitioner

## 2020-05-03 NOTE — Progress Notes (Signed)
Subjective:    Patient ID: Leah [name], female    DOB: 1945-05-04, 75 y.o.   MRN: [id number] Chief Complaint  Patient presents with  . New Patient (Initial Visit)    Ref Vinson Moselle varicose veins    Leah [name] is a 75 year old female that presents with lower extremity leg pain and swelling.  The patient notes that she has right lower extremity leg swelling.  She notes that the leg swelling goes from her groin down her entire leg.  On visual observation her right leg is noticeably larger than her left.  The patient denies any history of previous DVT in either lower extremity.  She has known right-sided sciatic issues.  The patient notes that if she stands for any length of time her right lower extremity begins to swell.  She also endorses having pain in her lower back.  She notes that her feet go numb when she has been standing for hours especially doing things like cooking.  The patient notes that she is able to walk for a good distance before having pain in her lower back but with the morning of distance she begins to have pain in her lower back when she is walking.  The patient also notes that she has known degenerative disc issues.  The patient had an evaluation done by her insurance as an overall health screening.  They did ABIs.  Using a [employer] she had ABIs of 0.79 on the left and 0.54 on the right.  She denies any classic claudication-like symptoms however and she denies any rest pain.   Review of Systems  Cardiovascular: Positive for leg swelling.  Musculoskeletal: Positive for back pain, gait problem and joint swelling.  Neurological: Positive for numbness.  All other systems reviewed and are negative.      Objective:   Physical Exam Vitals reviewed.  HENT:     Head: Normocephalic.  Cardiovascular:     Rate and Rhythm: Normal rate.     Pulses: Normal pulses.  Pulmonary:     Effort: Pulmonary effort is normal.  Musculoskeletal:     Right lower leg: 1+ Edema present.   Skin:    General: Skin is warm and dry.  Neurological:     Mental Status: She is alert and oriented to person, place, and time.  Psychiatric:        Mood and Affect: Mood normal.        Behavior: Behavior normal.        Thought Content: Thought content normal.        Judgment: Judgment normal.     BP 133/86 (BP Location: Right Arm)   Pulse (!) 57   Resp 16   Ht 5\' 8"  (1.727 m)   Wt 167 lb 3.2 oz (75.8 kg)   BMI 25.42 kg/m   Past Medical History:  Diagnosis Date  . Anemia   . Anxiety   . Hyperlipidemia   . Hypertension     Social History   Socioeconomic History  . Marital status: Divorced    Spouse name: Not on file  . Number of children: Not on file  . Years of education: Not on file  . Highest education level: Not on file  Occupational History  . Not on file  Tobacco Use  . Smoking status: Never Smoker  . Smokeless tobacco: Never Used  Substance and Sexual Activity  . Alcohol use: Yes    Comment: ocassionally  . Drug use: Never  . Sexual activity:  Not on file  Other Topics Concern  . Not on file  Social History Narrative  . Not on file   Social Determinants of Health   Financial Resource Strain: Not on file  Food Insecurity: Not on file  Transportation Needs: Not on file  Physical Activity: Not on file  Stress: Not on file  Social Connections: Not on file  Intimate Partner Violence: Not on file    Past Surgical History:  Procedure Laterality Date  . ANKLE SURGERY Right   . BREAST IMPLANT REMOVAL Bilateral   . SHOULDER SURGERY Right   . TUBAL LIGATION      Family History  Problem Relation Age of Onset  . Alcoholism Mother   . Anxiety disorder Mother   . Bipolar disorder Mother   . COPD Mother   . Heart disease Mother   . Alcoholism Father   . Heart attack Son     Allergies  Allergen Reactions  . Cephalexin   . Statins     Bring metablism down,tiredness,bring immune system down  . Sulfa Antibiotics     chest pains  . Codeine  Itching and Rash    Other reaction(s): Hallucination    CBC Latest Ref Rng & Units 08/07/2019 05/25/2014 02/21/2014  WBC 4.0 - 10.5 K/uL 9.0 6.0 8.1  Hemoglobin 12.0 - 15.0 g/dL 81.1 11.6(L) 11.9(L)  Hematocrit 36.0 - 46.0 % 38.0 35.3 35.8  Platelets 150 - 400 K/uL 360 330 386      CMP     Component Value Date/Time   NA 132 (L) 08/07/2019 1532   NA 134 (L) 05/25/2014 1903   K 3.9 08/07/2019 1532   K 3.8 05/25/2014 1903   CL 100 08/07/2019 1532   CL 99 (L) 05/25/2014 1903   CO2 23 08/07/2019 1532   CO2 28 05/25/2014 1903   GLUCOSE 98 08/07/2019 1532   GLUCOSE 108 (H) 05/25/2014 1903   BUN 17 08/07/2019 1532   BUN 12 05/25/2014 1903   CREATININE 1.16 (H) 08/07/2019 1532   CREATININE 0.99 05/25/2014 1903   CALCIUM 9.2 08/07/2019 1532   CALCIUM 9.4 05/25/2014 1903   PROT 6.9 05/25/2014 1903   ALBUMIN 3.7 05/25/2014 1903   AST 24 05/25/2014 1903   ALT 18 05/25/2014 1903   ALKPHOS 50 05/25/2014 1903   BILITOT 0.5 05/25/2014 1903   GFRNONAA 47 (L) 08/07/2019 1532   GFRNONAA >60 02/21/2014 0456   GFRNONAA 53 (L) 07/26/2013 1359   GFRAA 54 (L) 08/07/2019 1532   GFRAA >60 02/21/2014 0456   GFRAA >60 07/26/2013 1359     No results found.     Assessment & Plan:   1. Benign essential HTN Continue antihypertensive medications as already ordered, these medications have been reviewed and there are no changes at this time.   2. Leg swelling Because the patient's whole leg has edema we will evaluate her IVC iliacs in order to ensure no iliac level stenosis.  We will also evaluate her venous function with a lower extremity reflux study.  It is also possible that the patient's history of sciatica that this is also causing her edema.  The patient will follow up with these noninvasive studies at her convenience. - VAS [place] LOWER EXTREMITY VENOUS REFLUX; Future - VAS [place] IVC/ILIAC (VENOUS ONLY); Future  3. Pain in both lower extremities The patient had abnormal screening ABIs done.   The studies are not always consistent with accurate results.  Given the patient's numbness and lower extremity pain however,  we will repeat studies to evaluate whether her lower extremity pain and numbness are related to vascular issues. - VAS [place] ABI WITH/WO TBI; Future  4. Other osteoarthritis involving multiple joints Continue NSAID medications as already ordered, these medications have been reviewed and there are no changes at this time.  Continued activity and therapy was stressed.    Current Outpatient Medications on File Prior to Visit  Medication Sig Dispense Refill  . acetaminophen (TYLENOL) 500 MG tablet Take by mouth.    . bisacodyl (DULCOLAX) 5 MG EC tablet Take 5 mg by mouth daily as needed for moderate constipation.    Marland Kitchen CHARCOAL PO Take 260 mg by mouth.    . cyclobenzaprine (FLEXERIL) 10 MG tablet PLEASE SEE ATTACHED FOR DETAILED DIRECTIONS    . ezetimibe (ZETIA) 10 MG tablet Take 10 mg by mouth daily.    . fluticasone (FLONASE) 50 MCG/ACT nasal spray SMARTSIG:1 Spray(s) Both Nares Daily PRN    . hydrOXYzine (ATARAX/VISTARIL) 10 MG tablet Take 10 mg by mouth daily as needed.    . iron polysaccharides (FERREX 150) 150 MG capsule Take 150 mg by mouth daily.    . Melatonin 10 MG TABS Take by mouth.    . metoprolol tartrate (LOPRESSOR) 25 MG tablet Take 25 mg by mouth 2 (two) times daily.    . Multiple Vitamin (MULTI-VITAMIN) tablet Take 1 tablet by mouth daily.    Marland Kitchen spironolactone (ALDACTONE) 25 MG tablet Take 25 mg by mouth 2 (two) times daily.     No current facility-administered medications on file prior to visit.    There are no Patient Instructions on file for this visit. No follow-ups on file.   Georgiana Spinner, NP

## 2020-05-26 ENCOUNTER — Other Ambulatory Visit: Payer: Self-pay

## 2020-05-26 ENCOUNTER — Ambulatory Visit (INDEPENDENT_AMBULATORY_CARE_PROVIDER_SITE_OTHER): Payer: Medicare Other

## 2020-05-26 ENCOUNTER — Ambulatory Visit (INDEPENDENT_AMBULATORY_CARE_PROVIDER_SITE_OTHER): Payer: Medicare Other | Admitting: Nurse Practitioner

## 2020-05-26 ENCOUNTER — Encounter (INDEPENDENT_AMBULATORY_CARE_PROVIDER_SITE_OTHER): Payer: Self-pay | Admitting: Nurse Practitioner

## 2020-05-26 VITALS — BP 158/85 | HR 53 | Ht 60.0 in | Wt 169.0 lb

## 2020-05-26 DIAGNOSIS — M79604 Pain in right leg: Secondary | ICD-10-CM

## 2020-05-26 DIAGNOSIS — I10 Essential (primary) hypertension: Secondary | ICD-10-CM | POA: Diagnosis not present

## 2020-05-26 DIAGNOSIS — M7989 Other specified soft tissue disorders: Secondary | ICD-10-CM

## 2020-05-26 DIAGNOSIS — K56609 Unspecified intestinal obstruction, unspecified as to partial versus complete obstruction: Secondary | ICD-10-CM | POA: Insufficient documentation

## 2020-05-26 DIAGNOSIS — K449 Diaphragmatic hernia without obstruction or gangrene: Secondary | ICD-10-CM | POA: Insufficient documentation

## 2020-05-26 DIAGNOSIS — M79605 Pain in left leg: Secondary | ICD-10-CM

## 2020-05-26 DIAGNOSIS — R600 Localized edema: Secondary | ICD-10-CM | POA: Diagnosis not present

## 2020-05-26 DIAGNOSIS — L259 Unspecified contact dermatitis, unspecified cause: Secondary | ICD-10-CM | POA: Insufficient documentation

## 2020-05-26 DIAGNOSIS — J069 Acute upper respiratory infection, unspecified: Secondary | ICD-10-CM | POA: Insufficient documentation

## 2020-05-26 DIAGNOSIS — Z1211 Encounter for screening for malignant neoplasm of colon: Secondary | ICD-10-CM | POA: Insufficient documentation

## 2020-05-26 DIAGNOSIS — M6588 Other synovitis and tenosynovitis, other site: Secondary | ICD-10-CM | POA: Insufficient documentation

## 2020-05-26 DIAGNOSIS — J329 Chronic sinusitis, unspecified: Secondary | ICD-10-CM | POA: Insufficient documentation

## 2020-05-26 DIAGNOSIS — IMO0002 Reserved for concepts with insufficient information to code with codable children: Secondary | ICD-10-CM | POA: Insufficient documentation

## 2020-05-26 DIAGNOSIS — W57XXXA Bitten or stung by nonvenomous insect and other nonvenomous arthropods, initial encounter: Secondary | ICD-10-CM | POA: Insufficient documentation

## 2020-05-26 DIAGNOSIS — R5383 Other fatigue: Secondary | ICD-10-CM | POA: Insufficient documentation

## 2020-05-26 DIAGNOSIS — Z23 Encounter for immunization: Secondary | ICD-10-CM | POA: Insufficient documentation

## 2020-05-26 NOTE — Progress Notes (Signed)
Subjective:    Patient ID: Leah [name], female    DOB: May 15, 1945, 75 y.o.   MRN: [id number] Chief Complaint  Patient presents with  . Follow-up    Pt conv/ IVC/ iliac. BLE ven reflux  U/S     Leah [name] is a 75 year old female that presents with lower extremity leg pain and swelling.  The patient notes that she has right lower extremity leg swelling.  She notes that the leg swelling goes from her groin down her entire leg.  On visual observation her right leg is noticeably larger than her left.  The patient denies any history of previous DVT in either lower extremity.  She has known right-sided sciatic issues.  The patient notes that if she stands for any length of time her right lower extremity begins to swell.  She also endorses having pain in her lower back.  She notes that her feet go numb when she has been standing for hours especially doing things like cooking.  The patient notes that she is able to walk for a good distance before having pain in her lower back but with the morning of distance she begins to have pain in her lower back when she is walking.  The patient also notes that she has known degenerative disc issues.  The patient had an evaluation done by her insurance as an overall health screening.  They did ABIs.  Using a [employer] she had ABIs of 0.79 on the left and 0.54 on the right.  She denies any classic claudication-like symptoms however and she denies any rest pain.  Today the patient has ABIs of 1.27 on the right and 1.11 on the left.  The patient has triphasic tibial artery waveforms bilaterally with good toe waveforms bilaterally.  The patient underwent an IVC/iliac duplex and it showed no evidence of thrombus in the IVC and iliac veins.  No evidence of thrombus involving the right common iliac or left common iliac vein.  No evidence of thrombus involving the right external iliac or left external iliac veins.  The patient has no evidence of DVT or superficial  thrombophlebitis bilaterally.  No evidence of deep venous insufficiency seen bilaterally.  No evidence of superficial venous reflux seen in the left lower extremity.  Right lower extremity has evidence of reflux in the great saphenous vein at the proximal thigh extending to the distal thigh.  There is Baker's cyst noted behind the bilateral knees.  The right Baker's cyst measures 1.65 cm x 0.66 cm.  The left measures 2.24 cm x 1.30 cm.   Review of Systems  Cardiovascular: Positive for leg swelling.  Musculoskeletal: Positive for back pain and gait problem.  Neurological: Positive for numbness.  All other systems reviewed and are negative.      Objective:   Physical Exam Vitals reviewed.  HENT:     Head: Normocephalic.  Cardiovascular:     Rate and Rhythm: Normal rate.     Pulses: Normal pulses.  Pulmonary:     Effort: Pulmonary effort is normal.  Musculoskeletal:     Right lower leg: 2+ Edema present.     Left lower leg: 1+ Edema present.  Skin:    General: Skin is warm and dry.  Neurological:     Mental Status: She is alert and oriented to person, place, and time.  Psychiatric:        Mood and Affect: Mood normal.        Behavior: Behavior normal.  Thought Content: Thought content normal.        Judgment: Judgment normal.     BP (!) 158/85   Pulse (!) 53   Ht 5' (1.524 m)   Wt 169 lb (76.7 kg)   BMI 33.01 kg/m   Past Medical History:  Diagnosis Date  . Anemia   . Anxiety   . Hyperlipidemia   . Hypertension     Social History   Socioeconomic History  . Marital status: Divorced    Spouse name: Not on file  . Number of children: Not on file  . Years of education: Not on file  . Highest education level: Not on file  Occupational History  . Not on file  Tobacco Use  . Smoking status: Never Smoker  . Smokeless tobacco: Never Used  Substance and Sexual Activity  . Alcohol use: Yes    Comment: ocassionally  . Drug use: Never  . Sexual activity: Not  on file  Other Topics Concern  . Not on file  Social History Narrative  . Not on file   Social Determinants of Health   Financial Resource Strain: Not on file  Food Insecurity: Not on file  Transportation Needs: Not on file  Physical Activity: Not on file  Stress: Not on file  Social Connections: Not on file  Intimate Partner Violence: Not on file    Past Surgical History:  Procedure Laterality Date  . ANKLE SURGERY Right   . BREAST IMPLANT REMOVAL Bilateral   . SHOULDER SURGERY Right   . TUBAL LIGATION      Family History  Problem Relation Age of Onset  . Alcoholism Mother   . Anxiety disorder Mother   . Bipolar disorder Mother   . COPD Mother   . Heart disease Mother   . Alcoholism Father   . Heart attack Son     Allergies  Allergen Reactions  . Cephalexin   . Statins     Bring metablism down,tiredness,bring immune system down  . Sulfa Antibiotics     chest pains  . Codeine Itching and Rash    Other reaction(s): Hallucination    CBC Latest Ref Rng & Units 08/07/2019 05/25/2014 02/21/2014  WBC 4.0 - 10.5 K/uL 9.0 6.0 8.1  Hemoglobin 12.0 - 15.0 g/dL 42.3 11.6(L) 11.9(L)  Hematocrit 36.0 - 46.0 % 38.0 35.3 35.8  Platelets 150 - 400 K/uL 360 330 386      CMP     Component Value Date/Time   NA 132 (L) 08/07/2019 1532   NA 134 (L) 05/25/2014 1903   K 3.9 08/07/2019 1532   K 3.8 05/25/2014 1903   CL 100 08/07/2019 1532   CL 99 (L) 05/25/2014 1903   CO2 23 08/07/2019 1532   CO2 28 05/25/2014 1903   GLUCOSE 98 08/07/2019 1532   GLUCOSE 108 (H) 05/25/2014 1903   BUN 17 08/07/2019 1532   BUN 12 05/25/2014 1903   CREATININE 1.16 (H) 08/07/2019 1532   CREATININE 0.99 05/25/2014 1903   CALCIUM 9.2 08/07/2019 1532   CALCIUM 9.4 05/25/2014 1903   PROT 6.9 05/25/2014 1903   ALBUMIN 3.7 05/25/2014 1903   AST 24 05/25/2014 1903   ALT 18 05/25/2014 1903   ALKPHOS 50 05/25/2014 1903   BILITOT 0.5 05/25/2014 1903   GFRNONAA 47 (L) 08/07/2019 1532   GFRNONAA  >60 02/21/2014 0456   GFRNONAA 53 (L) 07/26/2013 1359   GFRAA 54 (L) 08/07/2019 1532   GFRAA >60 02/21/2014 0456   GFRAA >  60 07/26/2013 1359     No results found.     Assessment & Plan:   1. Pain in both lower extremities Recommend:  I do not find evidence of Vascular pathology that would explain the patient's symptoms  The patient has atypical pain symptoms for vascular disease  I do not find evidence of Vascular pathology that would explain the patient's symptoms and I suspect the patient is c/o pseudoclaudication.  Patient should have an evaluation of his LS spine which I defer to the primary service.  Noninvasive studies including venous ultrasound of the legs do not identify vascular problems  The patient should continue walking and begin a more formal exercise program. The patient should continue his antiplatelet therapy and aggressive treatment of the lipid abnormalities. The patient should begin wearing graduated compression socks 15-20 mmHg strength to control her mild edema.  We will refer patient to orthopedic surgery for evaluation of her lower back issues.    2. Leg swelling The patient does have evidence of chronic venous insufficiency in her right great saphenous vein.  What is surprising is that her swelling is all above her knee.  This may be related to inflammatory changes.  We discussed in endovenous laser ablation for her right lower extremity.  I noted that it may help with the swelling but in terms of the pain with standing, walking and the numbness and tingling in her toes, this likely would not improve.  The patient is also concerned due to her strong family history of cardiac disease.  The patient is aware that if she were ever to the open heart bypass surgery her saphenous veins will likely be necessary and at this time she does not want to risk not having those.  She would also like to pursue possible issues with her back prior to any interventions on her  veins.  Therefore we will have the patient return to the office in 6 months or sooner if necessary.  3. Benign essential HTN Continue antihypertensive medications as already ordered, these medications have been reviewed and there are no changes at this time.    Current Outpatient Medications on File Prior to Visit  Medication Sig Dispense Refill  . acetaminophen (TYLENOL) 500 MG tablet Take by mouth.    . bisacodyl (DULCOLAX) 5 MG EC tablet Take 5 mg by mouth daily as needed for moderate constipation.    Marland Kitchen CHARCOAL PO Take 260 mg by mouth.    . cyclobenzaprine (FLEXERIL) 10 MG tablet PLEASE SEE ATTACHED FOR DETAILED DIRECTIONS    . ezetimibe (ZETIA) 10 MG tablet Take 10 mg by mouth daily.    . fluticasone (FLONASE) 50 MCG/ACT nasal spray SMARTSIG:1 Spray(s) Both Nares Daily PRN    . hydrOXYzine (ATARAX/VISTARIL) 10 MG tablet Take 10 mg by mouth daily as needed.    . iron polysaccharides (NIFEREX) 150 MG capsule Take 150 mg by mouth daily.    . Melatonin 10 MG TABS Take by mouth.    . metoprolol tartrate (LOPRESSOR) 25 MG tablet Take 25 mg by mouth 2 (two) times daily.    . Multiple Vitamin (MULTI-VITAMIN) tablet Take 1 tablet by mouth daily.    Marland Kitchen spironolactone (ALDACTONE) 25 MG tablet Take 25 mg by mouth 2 (two) times daily.     No current facility-administered medications on file prior to visit.    There are no Patient Instructions on file for this visit. No follow-ups on file.   Georgiana Spinner, NP

## 2020-05-27 ENCOUNTER — Telehealth (INDEPENDENT_AMBULATORY_CARE_PROVIDER_SITE_OTHER): Payer: Self-pay

## 2020-05-27 NOTE — Telephone Encounter (Signed)
We discussed this.  We would close a section of her great saphenous vein.  It doesn't affect her body greatly but if she need heart surgery that vein wouldn't be available.  There's no harm to her body in not doing the procedure before having her back evaluated.

## 2020-05-27 NOTE — Telephone Encounter (Signed)
Patient was seen on 05/26/20 and has thought of a question and would like an answer. Patient wondered if she does not have the procedure what will happen and how will it affect her body. Please advise.

## 2020-05-28 NOTE — Telephone Encounter (Signed)
I attempted to contact the patient and a message was left for a return call. 

## 2020-06-02 NOTE — Telephone Encounter (Signed)
Patient left a message for a a return call. I contacted the patient and gave her the recommendations from Sheppard Plumber NP. See notes below.

## 2020-11-25 ENCOUNTER — Ambulatory Visit (INDEPENDENT_AMBULATORY_CARE_PROVIDER_SITE_OTHER): Payer: Medicare Other | Admitting: Vascular Surgery

## 2021-03-02 IMAGING — MR MR HEAD WO/W CM
15 series · 44 of 48 positions shown · IV contrast (gadavist)
Comparison: 08/07/2019 head CT and prior.

CLINICAL DATA: altered mental status

EXAM:
MRI HEAD WITHOUT AND WITH CONTRAST
TECHNIQUE: Multiplanar, multiecho pulse sequences of the brain and surrounding
structures were obtained without and with intravenous contrast.
CONTRAST:  7mL GADAVIST GADOBUTROL 1 MMOL/ML IV SOLN

[Series 5: T1 · sagittal · 5.0mm · 0.62mm/px · 2 of 21 slices shown (1 of 2)]
[im 1/21]
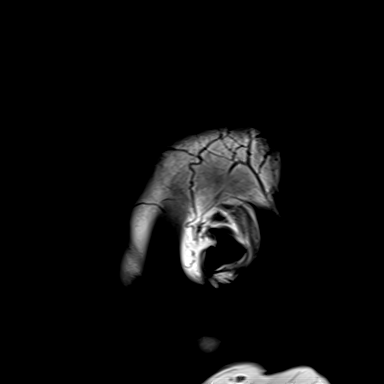
[im 21/21]
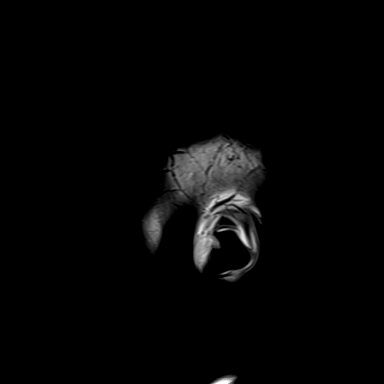

[Series 6: ax dwi_tracew · axial · 3.0mm · 0.60mm/px · z∈[-127,+27]mm · 2 of 48 slices shown]
[im 1/48]
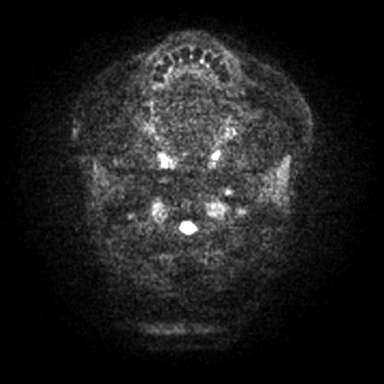
[im 48/48]
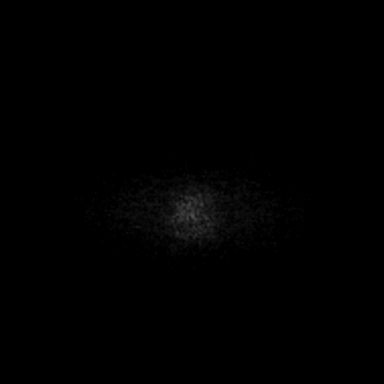

[Series 7: ax dwi_adc · axial · 3.0mm · 0.60mm/px · z∈[-127,+24]mm · 2 of 47 slices shown]
[im 1/47]
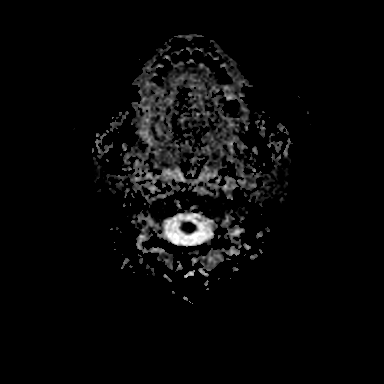
[im 47/47]
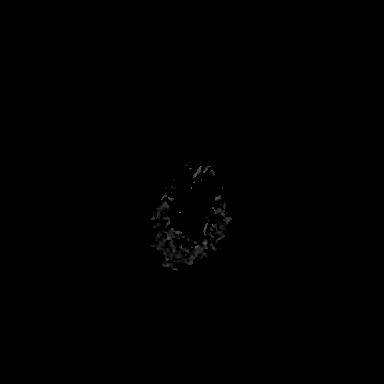

[Series 8: cor dwi_tracew · coronal · 5.0mm · 0.60mm/px · 2 of 40 slices shown]
[im 1/40]
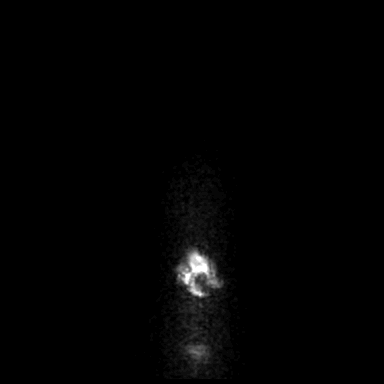
[im 40/40]
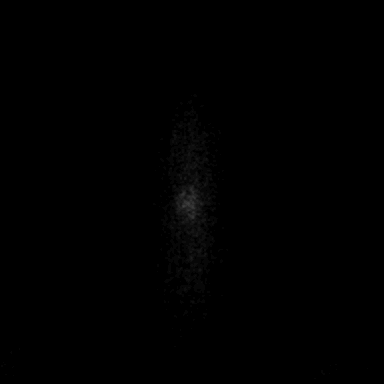

[Series 9: cor dwi_adc · coronal · 5.0mm · 0.60mm/px · 2 of 39 slices shown]
[im 1/39]
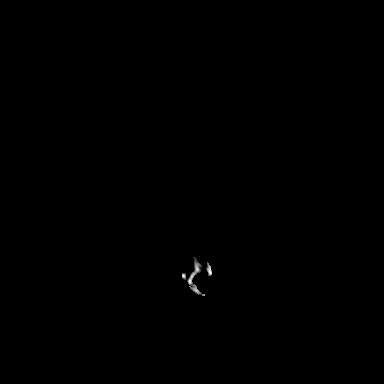
[im 39/39]
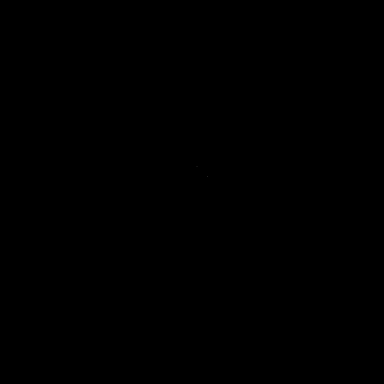

[Series 10: T2 · axial · 5.0mm · 0.53mm/px · 1 of 25 slices shown (1 of 2)]
[im 1/25]
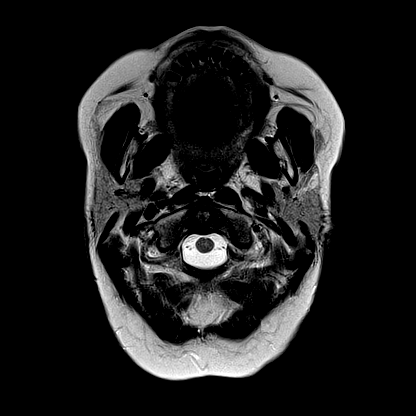

[Series 12: pha_images · axial · 3.0mm · 0.90mm/px · z∈[-137,+39]mm · 3 of 58 slices shown]
[im 1/58]
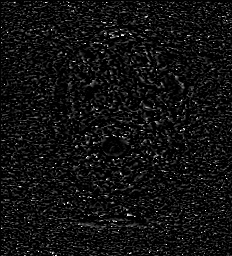
[im 29/58]
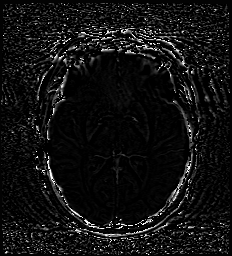
[im 58/58]
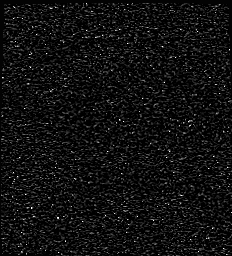

[Series 13: swi_images · axial · 3.0mm · 0.90mm/px · z∈[-137,+39]mm · 3 of 60 slices shown]
[im 1/60]
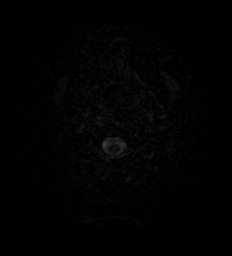
[im 30/60]
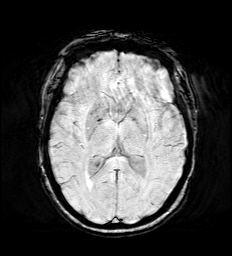
[im 60/60]
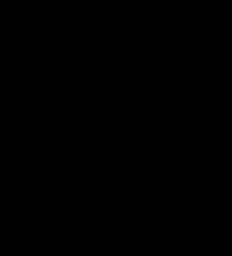

[Series 15: FLAIR · axial · 3.0mm · 0.53mm/px · z∈[-129,+32]mm · 3 of 55 slices shown (1 of 2)]
[im 1/55]
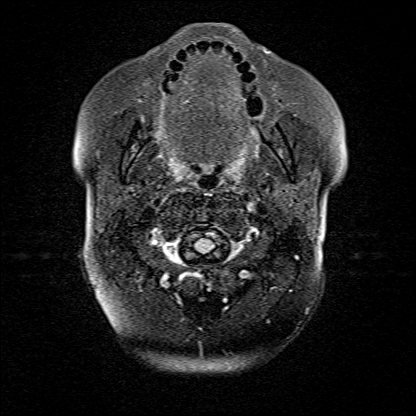
[im 28/55]
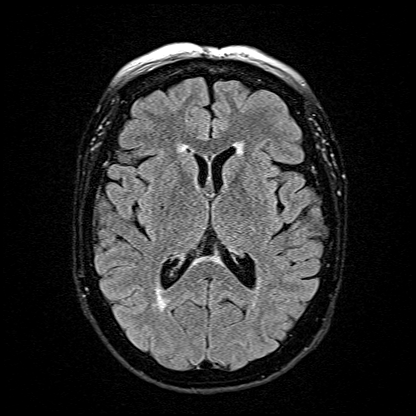
[im 55/55]
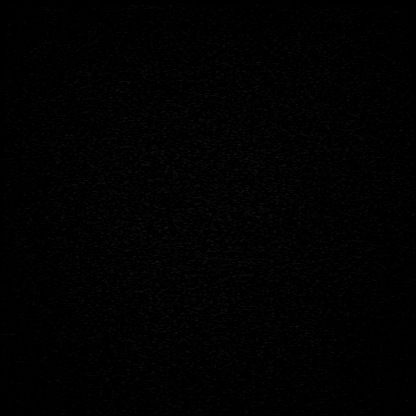

[Series 16: T1 · axial · 1.0mm · 0.98mm/px · z∈[-137,+36]mm · 8 of 175 slices shown (2 of 2)]
[im 1/175]
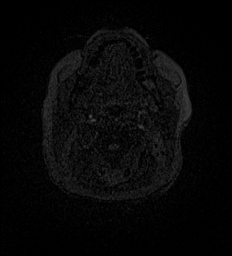
[im 22/175]
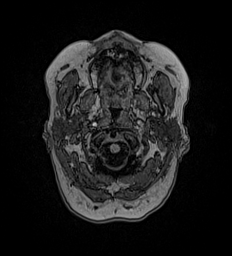
[im 44/175]
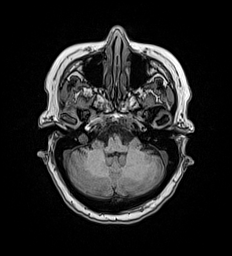
[im 66/175]
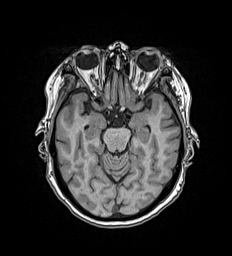
[im 109/175]
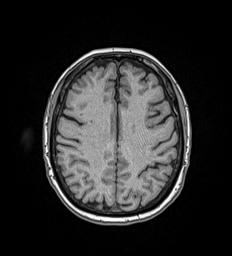
[im 131/175]
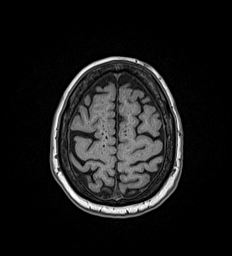
[im 153/175]
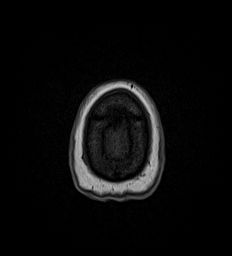
[im 175/175]
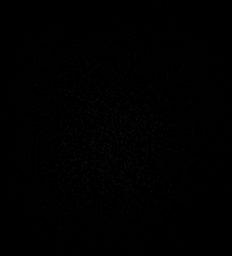

[Series 17: T2 · coronal · 3.0mm · 0.47mm/px · 2 of 35 slices shown (2 of 2)]
[im 1/35]
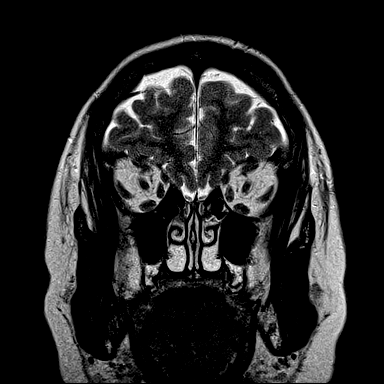
[im 35/35]
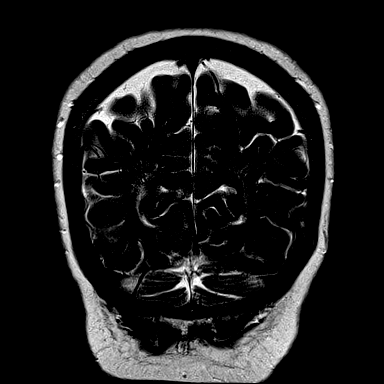

[Series 18: FLAIR · coronal · 3.0mm · 0.47mm/px · 2 of 35 slices shown (2 of 2)]
[im 1/35]
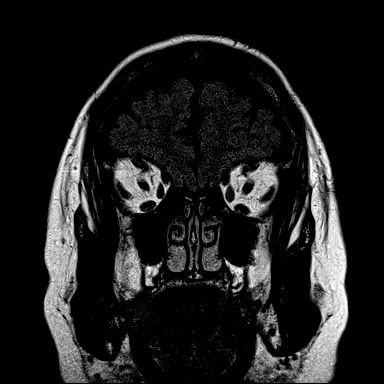
[im 35/35]
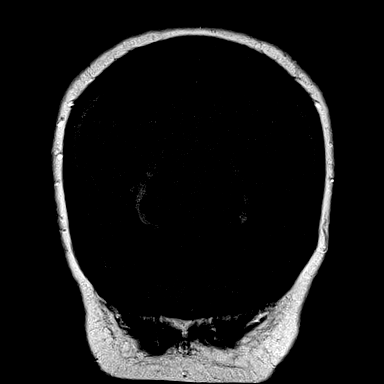

[Series 19: T1 post-contrast · axial · 1.0mm · 0.98mm/px · z∈[-137,+36]mm · 9 of 176 slices shown (1 of 2)]
[im 1/176]
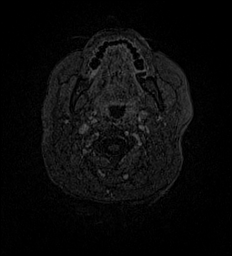
[im 22/176]
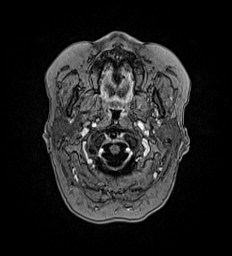
[im 44/176]
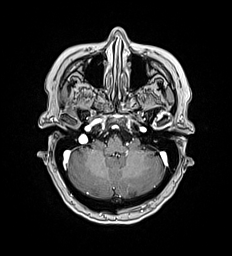
[im 66/176]
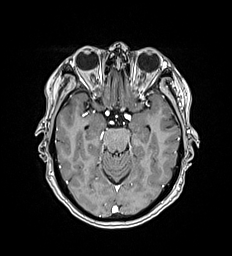
[im 88/176]
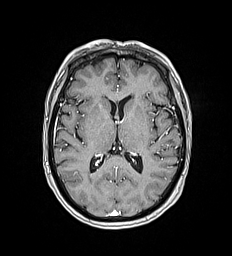
[im 110/176]
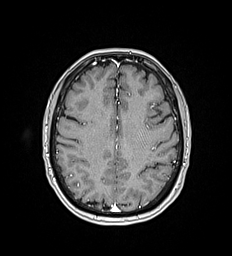
[im 132/176]
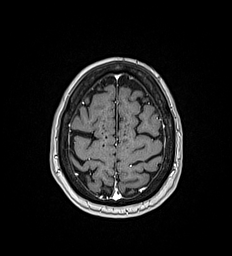
[im 154/176]
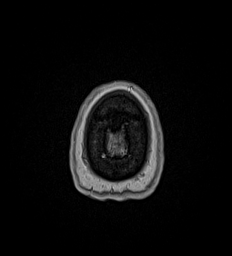
[im 176/176]
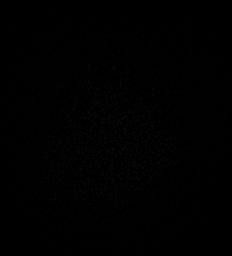

[Series 20: T1 post-contrast · coronal · 5.0mm · 0.57mm/px · 1 of 29 slices shown (2 of 2)]
[im 1/29]
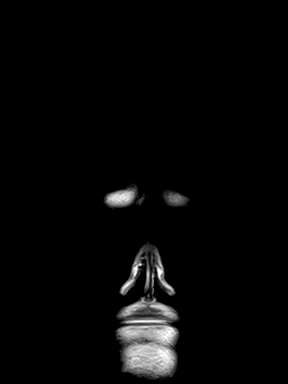

[Series 1019: cor thins rl · coronal · 3.0mm · 0.49mm/px · 2 of 107 slices shown]
[im 1/107]
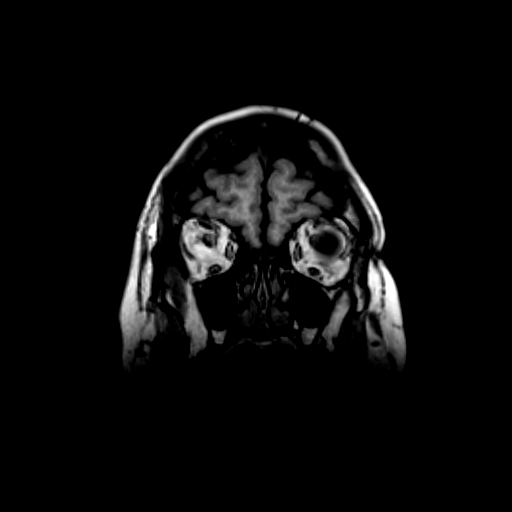
[im 27/107]
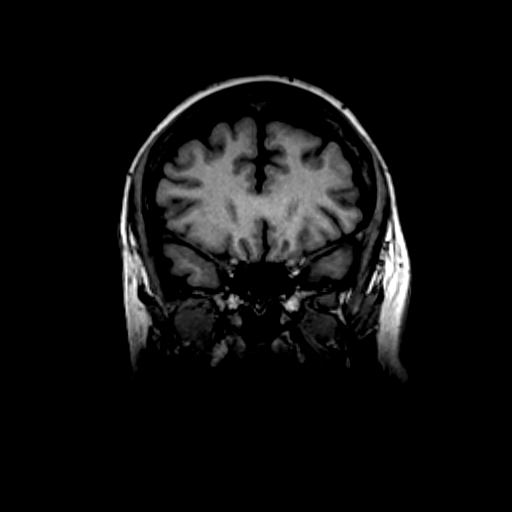

[44 of 48 positions shown; findings below may reference images not displayed]

FINDINGS: Brain: Scattered T2 hyperintense foci involving the
subcortical/periventricular white matter and pons. No acute infarct
or intracranial hemorrhage. No midline shift, ventriculomegaly or
extra-axial fluid collection. No mass lesion. No abnormal
enhancement. Cerebral volume within normal limits.

The bilateral hippocampal complexes are symmetric demonstrating
normal morphology and signal intensity.

Vascular: Normal flow voids.

Skull and upper cervical spine: Left TMJ osteoarthrosis. Otherwise
normal bone marrow signal intensity.

Sinuses/Orbits: Normal orbits. Clear paranasal sinuses. No mastoid
effusion.

Other: None.
IMPRESSION: No acute intracranial process. Normal appearance of the bilateral
hippocampi.

Scattered white matter and pontine T2 hyperintense foci are
nonspecific. Differential includes chronic microvascular ischemic
changes, vasculitis, post infectious/inflammatory sequela and less
likely demyelination.

Left TMJ osteoarthrosis.
